# Patient Record
Sex: Male | Born: 1970 | Race: Black or African American | Hispanic: No | Marital: Single | Smoking: Current every day smoker
Health system: Southern US, Community
[De-identification: ages and names within clinical notes are randomized; demographics above are authoritative.]

## PROBLEM LIST (undated history)

## (undated) HISTORY — PX: FACIAL COSMETIC SURGERY: SHX629

---

## 2008-10-17 ENCOUNTER — Emergency Department (HOSPITAL_COMMUNITY): Admission: EM | Admit: 2008-10-17 | Discharge: 2008-10-17 | Payer: Self-pay | Admitting: Emergency Medicine

## 2013-01-09 ENCOUNTER — Emergency Department (HOSPITAL_COMMUNITY)
Admission: EM | Admit: 2013-01-09 | Discharge: 2013-01-09 | Discharge status: Against Medical Advice | Payer: Self-pay | Attending: Emergency Medicine | Admitting: Emergency Medicine

## 2013-01-09 ENCOUNTER — Encounter (HOSPITAL_COMMUNITY): Payer: Self-pay | Admitting: Emergency Medicine

## 2013-01-09 ENCOUNTER — Emergency Department (HOSPITAL_COMMUNITY)
Admission: EM | Admit: 2013-01-09 | Discharge: 2013-01-09 | Disposition: A | Discharge status: Home / Self Care | Payer: Self-pay | Attending: Emergency Medicine | Admitting: Emergency Medicine

## 2013-01-09 DIAGNOSIS — S0180XA Unspecified open wound of other part of head, initial encounter: Secondary | ICD-10-CM | POA: Insufficient documentation

## 2013-01-09 DIAGNOSIS — Y929 Unspecified place or not applicable: Secondary | ICD-10-CM | POA: Insufficient documentation

## 2013-01-09 DIAGNOSIS — Y9389 Activity, other specified: Secondary | ICD-10-CM | POA: Insufficient documentation

## 2013-01-09 DIAGNOSIS — S0181XA Laceration without foreign body of other part of head, initial encounter: Secondary | ICD-10-CM

## 2013-01-09 DIAGNOSIS — W06XXXA Fall from bed, initial encounter: Secondary | ICD-10-CM | POA: Insufficient documentation

## 2013-01-09 DIAGNOSIS — Y92009 Unspecified place in unspecified non-institutional (private) residence as the place of occurrence of the external cause: Secondary | ICD-10-CM | POA: Insufficient documentation

## 2013-01-09 DIAGNOSIS — S01409A Unspecified open wound of unspecified cheek and temporomandibular area, initial encounter: Secondary | ICD-10-CM | POA: Insufficient documentation

## 2013-01-09 DIAGNOSIS — X58XXXA Exposure to other specified factors, initial encounter: Secondary | ICD-10-CM | POA: Insufficient documentation

## 2013-01-09 DIAGNOSIS — W01119A Fall on same level from slipping, tripping and stumbling with subsequent striking against unspecified sharp object, initial encounter: Secondary | ICD-10-CM | POA: Insufficient documentation

## 2013-01-09 NOTE — ED Notes (Signed)
Patient LWBS after triage earlier this morning so he could go home and get his wallet; patient has returned and has been re-registered.  Complaining of laceration to right cheek -- hit dresser while getting up from bed this morning.  Patient denies pain; bleeding controlled.

## 2013-01-09 NOTE — ED Provider Notes (Signed)
History     CSN: [id number]  Arrival date & time 01/09/13  7829   First MD Initiated Contact with Patient 01/09/13 [phone]      Chief Complaint  Patient presents with  . Facial Laceration    (Consider location/radiation/quality/duration/timing/severity/associated sxs/prior treatment) HPI Comments: Patient presents today with a laceration of his right cheek.  He reports that he hit his face on the edge of the dresser while getting out of bed this morning.  No LOC.  He denies pain.  Bleeding controlled at this time.  He reports that his tetanus is UTD.  Patient is a 42 y.o. male presenting with skin laceration. The history is provided by the patient.  Laceration Foreign body present:  No foreign bodies Ineffective treatments:  None tried   History reviewed. No pertinent past medical history.  History reviewed. No pertinent past surgical history.  History reviewed. No pertinent family history.  History  Substance Use Topics  . Smoking status: Never Smoker   . Smokeless tobacco: Not on file  . Alcohol Use: No      Review of Systems  Skin: Positive for wound.  All other systems reviewed and are negative.    Allergies  Review of patient's allergies indicates no known allergies.  Home Medications  No current outpatient prescriptions on file.  BP 120/71  Pulse 80  Temp(Src) 98.1 F (36.7 C) (Oral)  Resp 18  SpO2 97%  Physical Exam  Nursing note and vitals reviewed. Constitutional: He appears well-developed and well-nourished. No distress.  HENT:  Head: Normocephalic and atraumatic.    Eyes: EOM are normal.  Neck: Normal range of motion. Neck supple.  Cardiovascular: Normal rate, regular rhythm and normal heart sounds.   Pulmonary/Chest: Effort normal and breath sounds normal.  Neurological: He is alert. No cranial nerve deficit. Gait normal.  Skin: Skin is warm and dry. Laceration noted. He is not diaphoretic.    ED Course  Procedures (including critical  care time)  Labs Reviewed - No data to display No results found.   No diagnosis found.  LACERATION REPAIR Performed by: Anne Shutter, Carmichael Burdette Authorized by: Anne Shutter, Herbert Seta Consent: Verbal consent obtained. Risks and benefits: risks, benefits and alternatives were discussed Consent given by: patient Patient identity confirmed: provided demographic data Prepped and Draped in normal sterile fashion Wound explored  Laceration Location: right cheek  Laceration Length: 1.5 cm  No Foreign Bodies seen or palpated  Anesthesia: local infiltration  Local anesthetic: lidocaine 2% with epinephrine  Anesthetic total: 3 ml  Irrigation method: syringe Amount of cleaning: standard  Skin closure: 5-0 Nylon  Number of sutures: 3  Technique: Simple interrupted  Patient tolerance: Patient tolerated the procedure well with no immediate complications.  MDM  Patient presenting with facial laceration.  Tetanus UTD.  Laceration repaired without difficulty.          Pascal Lux Stockton, PA-C 01/09/13 1734

## 2013-01-09 NOTE — ED Notes (Signed)
Pt dc'd home w/all belongings, no new rx given, alert and ambulatory upon dc, drove self home, no narcotics given in ed

## 2013-01-09 NOTE — ED Notes (Signed)
Patient walking out to his car to get his Art therapist.

## 2013-01-09 NOTE — ED Notes (Signed)
Pt c/o laceration under (L) eye d/t hitting it on the corner of his dresser upon waking this am. Pt turned his furniture around last night and forgot his dresser was now beside of his bed. Pt denies dizziness or LOC. approx 1 in lac

## 2013-01-09 NOTE — ED Notes (Addendum)
Patient told registration that he was going home to get his wallet; patient advised not to leave by registration.  Patient LWBS after triage without notifying medical staff.

## 2013-01-09 NOTE — ED Provider Notes (Signed)
Medical screening examination/treatment/procedure(s) were performed by non-physician practitioner and as supervising physician I was immediately available for consultation/collaboration.   Gavin Pound. Oletta Lamas, MD 01/09/13 2155

## 2013-01-09 NOTE — ED Notes (Signed)
Patient reports that he hit his right cheek on a dresser this morning when he was getting out of bed; 3/4 inch laceration noted to right cheek; no bleeding at this time.  Will continue to monitor.

## 2013-01-14 ENCOUNTER — Encounter (HOSPITAL_COMMUNITY): Payer: Self-pay | Admitting: Emergency Medicine

## 2013-01-14 ENCOUNTER — Emergency Department (INDEPENDENT_AMBULATORY_CARE_PROVIDER_SITE_OTHER)
Admission: EM | Admit: 2013-01-14 | Discharge: 2013-01-14 | Disposition: A | Discharge status: Home / Self Care | Payer: Self-pay | Source: Home / Self Care | Attending: Emergency Medicine | Admitting: Emergency Medicine

## 2013-01-14 DIAGNOSIS — IMO0002 Reserved for concepts with insufficient information to code with codable children: Secondary | ICD-10-CM

## 2013-01-14 DIAGNOSIS — T148XXA Other injury of unspecified body region, initial encounter: Secondary | ICD-10-CM

## 2013-01-14 NOTE — ED Provider Notes (Signed)
Chief Complaint:  Laceration, suture removal.  History of Present Illness:   Sean Gray is a 42 year old male who sustained a laceration to his right cheek a week ago. This was sutured at the hospital emergency room he returns today for suture removal. The wound is healing well without evidence of infection. He denies any headache, visual, or neurological symptoms.  Review of Systems:  Other than noted above, the patient denies any of the following symptoms: Systemic:  No fevers, chills, sweats, weight loss or gain, fatigue, or tiredness. ENT: No facial pain or swelling. Neurological: No headache, diplopia, blurry vision, paresthesias, difficulty speaking, or difficulty with ambulation.  PMFSH:  Past medical history, family history, social history, meds, and allergies were reviewed.   Physical Exam:   Vital signs:  BP 121/88  Pulse 80  Temp(Src) 98.1 F (36.7 C) (Oral)  Resp 16  SpO2 100% General:  Alert and oriented.  In no distress.  Skin warm and dry. Eyes: PERRLA, lids and conjunctiva is normal. ENT: He has a small laceration on his right cheek which had been closed with 3 sutures. There is no swelling or evidence of infection. Nose, lips, neck, and ears all appear normal. Neurological exam: Alert and oriented x3, speech is clear and appropriate, no extremity weakness, no facial asymmetry.  Course in Urgent Care Center:   The wound was prepped with alcohol, sutures were removed, antibiotic ointment and a Band-Aid dressing were applied. Patient was instructed in wound care.  Assessment:  The encounter diagnosis was Laceration.  Plan:   1.  The following meds were prescribed:   New Prescriptions   No medications on file   2.  The patient was instructed in symptomatic care and handouts were given.he was given instructions in wound care. 3.  The patient was told to return if becoming worse in any way, if no better in 3 or 4 days, and given some red flag symptoms such as evidence  of infection such as swelling, redness, or purulent drainage that would indicate earlier return.     Reuben Likes, MD 01/14/13 [phone]

## 2013-01-14 NOTE — ED Notes (Signed)
Patient presents for suture removal.

## 2013-05-07 ENCOUNTER — Encounter (HOSPITAL_COMMUNITY): Payer: Self-pay | Admitting: Emergency Medicine

## 2013-05-07 ENCOUNTER — Emergency Department (HOSPITAL_COMMUNITY)
Admission: EM | Admit: 2013-05-07 | Discharge: 2013-05-07 | Disposition: A | Discharge status: Home / Self Care | Payer: Self-pay | Attending: Emergency Medicine | Admitting: Emergency Medicine

## 2013-05-07 DIAGNOSIS — F17200 Nicotine dependence, unspecified, uncomplicated: Secondary | ICD-10-CM | POA: Insufficient documentation

## 2013-05-07 DIAGNOSIS — K047 Periapical abscess without sinus: Secondary | ICD-10-CM | POA: Insufficient documentation

## 2013-05-07 MED ORDER — PENICILLIN V POTASSIUM 500 MG PO TABS: 500.0000 mg | ORAL_TABLET | Freq: Four times a day (QID) | ORAL | Status: AC

## 2013-05-07 MED ORDER — HYDROCODONE-ACETAMINOPHEN 5-325 MG PO TABS: 1.0000 | ORAL_TABLET | ORAL | Status: DC | PRN

## 2013-05-07 NOTE — ED Provider Notes (Signed)
Medical screening examination/treatment/procedure(s) were performed by non-physician practitioner and as supervising physician I was immediately available for consultation/collaboration.   Gwyneth Sprout, MD 05/07/13 2156

## 2013-05-07 NOTE — ED Provider Notes (Signed)
  CSN: [id number]     Arrival date & time 05/07/13  4098 History     First MD Initiated Contact with Patient 05/07/13 [phone]     Chief Complaint  Patient presents with  . Dental Pain   (Consider location/radiation/quality/duration/timing/severity/associated sxs/prior Treatment) The history is provided by the patient and medical records.   Patient resents to the ED for right upper dental pain. Patient states the pain is very intense, and he has been unable to sleep all night. Pain described as a throbbing sensation along his upper jaw.  Denies any difficulty swallowing. No trismus. No history of prior dental abscess. No numbness or paresthesias of face.  Patient is not currently established with a dentist. Has not taken any medications for his symptoms.  History reviewed. No pertinent past medical history. History reviewed. No pertinent past surgical history. No family history on file. History  Substance Use Topics  . Smoking status: Current Every Day Smoker  . Smokeless tobacco: Not on file  . Alcohol Use: Yes    Review of Systems  HENT: Positive for dental problem.   All other systems reviewed and are negative.    Allergies  Review of patient's allergies indicates no known allergies.  Home Medications  No current outpatient prescriptions on file. BP 126/88  Pulse 64  Temp(Src) 97.4 F (36.3 C) (Oral)  Resp 22  SpO2 97%  Physical Exam  Nursing note and vitals reviewed. Constitutional: He is oriented to person, place, and time. He appears well-developed and well-nourished.  HENT:  Head: Normocephalic and atraumatic. No trismus in the jaw.  Mouth/Throat: Uvula is midline, oropharynx is clear and moist and mucous membranes are normal. No oral lesions. Abnormal dentition. Dental abscesses and dental caries present. No edematous. No oropharyngeal exudate, posterior oropharyngeal edema, posterior oropharyngeal erythema or tonsillar abscesses.    Teeth largely in poor  dentition,  Right upper molar TTP, surrounding gingiva swollen and erythematous with exudate present; no oropharyngeal edema or PTA, airway patent, handling secretions appropriately, no trismus  Eyes: Conjunctivae and EOM are normal. Pupils are equal, round, and reactive to light.  Neck: Normal range of motion. Neck supple.  Cardiovascular: Normal rate, regular rhythm and normal heart sounds.   Pulmonary/Chest: Effort normal and breath sounds normal.  Musculoskeletal: Normal range of motion.  Neurological: He is alert and oriented to person, place, and time.  Skin: Skin is warm and dry.  Psychiatric: He has a normal mood and affect.    ED Course   Procedures (including critical care time)  Labs Reviewed - No data to display No results found.  1. Dental abscess     MDM   Dental pain and dental abscess.  Pt handling secretions appropriately, airway patent.  Rx pen VK and vicodin.  FU with dentist-- community list given.  Discussed plan with pt, he agreed.  Return precautions advised.  Garlon Hatchet, PA-C 05/07/13 1044

## 2013-05-07 NOTE — ED Notes (Signed)
Started last pm with right upper dental pain. Pt is drooling and spitting into a bag. States pain is severe.

## 2013-09-06 ENCOUNTER — Emergency Department (HOSPITAL_COMMUNITY)
Admission: EM | Admit: 2013-09-06 | Discharge: 2013-09-06 | Disposition: A | Discharge status: Home / Self Care | Payer: Self-pay | Attending: Emergency Medicine | Admitting: Emergency Medicine

## 2013-09-06 ENCOUNTER — Encounter (HOSPITAL_COMMUNITY): Payer: Self-pay | Admitting: Emergency Medicine

## 2013-09-06 DIAGNOSIS — L02818 Cutaneous abscess of other sites: Secondary | ICD-10-CM | POA: Insufficient documentation

## 2013-09-06 DIAGNOSIS — L02811 Cutaneous abscess of head [any part, except face]: Secondary | ICD-10-CM

## 2013-09-06 DIAGNOSIS — F17200 Nicotine dependence, unspecified, uncomplicated: Secondary | ICD-10-CM | POA: Insufficient documentation

## 2013-09-06 MED ORDER — SULFAMETHOXAZOLE-TRIMETHOPRIM 800-160 MG PO TABS: 1.0000 | ORAL_TABLET | Freq: Two times a day (BID) | ORAL | Status: DC

## 2013-09-06 NOTE — ED Notes (Signed)
Pt reports developing an abscess to the posterior of his head about one month ago. Pt attempted to drain this himself with a sewing needle. Pt reports developing another abscess on the posterior of his head, which he wanted to be evaluated due to the size. Pt denies fever and is afebrile during triage. Pt states he has had a tetanus vaccine less than five years ago.

## 2013-09-06 NOTE — ED Provider Notes (Signed)
CSN: [id number]     Arrival date & time 09/06/13  2055 History   First MD Initiated Contact with Patient 09/06/13 2104    This chart was scribed for Ivonne Andrew PA-C, a non-physician practitioner working with Suzi Roots, MD by Lewanda Rife, ED Scribe. This patient was seen in room WTR8/WTR8 and the patient's care was started at 9:53 PM     Chief Complaint  Patient presents with  . Abscess   The history is provided by the patient. No language interpreter was used.   HPI Comments: Sean Gray is a 42 y.o. male who presents to the Emergency Department complaining of worsening abscess to the occipital scalp onset 1 month. Reports pt attempted to drain abscess himself with a sewing needle. Since that time there has been worsening pain, redness and swelling. Reports associated occasional drainage. Describes abscess as severely painful. Reports pain is exacerbated by touch and alleviated by nothing. Denies associated fever. Reports tetanus status is up to date within the last 5 years.    History reviewed. No pertinent past medical history. History reviewed. No pertinent past surgical history. No family history on file. History  Substance Use Topics  . Smoking status: Current Every Day Smoker  . Smokeless tobacco: Not on file  . Alcohol Use: Yes    Review of Systems  Constitutional: Negative for fever and chills.  Skin:       Abscess   All other systems reviewed and are negative.  A complete 10 system review of systems was obtained and all systems are negative except as noted in the HPI and PMHx.     Allergies  Review of patient's allergies indicates no known allergies.  Home Medications   Current Outpatient Rx  Name  Route  Sig  Dispense  Refill  . ibuprofen (ADVIL,MOTRIN) 200 MG tablet   Oral   Take 200 mg by mouth every 6 (six) hours as needed.         . Nutritional Supplements (DETOX ENZYME FORMULA PO)   Oral   Take 1 tablet by mouth daily.          BP  144/88  Pulse 86  Temp(Src) 98.4 F (36.9 C) (Oral)  Resp 20  SpO2 98% Physical Exam  Nursing note and vitals reviewed. Constitutional: He is oriented to person, place, and time. He appears well-developed and well-nourished. No distress.  HENT:  Head: Normocephalic.  Eyes: Conjunctivae are normal.  Cardiovascular: Normal rate and regular rhythm.   Pulmonary/Chest: Effort normal and breath sounds normal.  Neurological: He is alert and oriented to person, place, and time.  Skin: Skin is warm.  2 cm nodular area to the right posterior lower scalp with surrounding erythema and induration. There is a central area of skin damage and scabbing. No bleeding or drainage.  Psychiatric: He has a normal mood and affect. His behavior is normal.    ED Course  Procedures   COORDINATION OF CARE:  Nursing notes reviewed. Vital signs reviewed. Initial pt interview and examination performed.   9:50 PM-patient seen and evaluated. Patient well-appearing no acute distress. Does not appear severely ill or toxic. Discussed treatment plan with pt at bedside, which includes I&D. Pt agrees with plan.  10:06 PM INCISION AND DRAINAGE Performed [id number] Dariusz Brase PA-C Consent: Verbal consent obtained. Risks and benefits: risks, benefits and alternatives were discussed Time out performed prior to procedure Type: abscess Body area: occipital scalp Anesthesia: local infiltration Incision was made with a scalpel. Local anesthetic:  lidocaine 2% without epinephrine Anesthetic total: 2 ml Complexity: complex Blunt dissection to break up loculations Drainage: purulent Drainage amount: Small  Packing material: none Patient tolerance: Patient tolerated the procedure well with no immediate complications.       MDM   1. Abscess of scalp      I personally performed the services described in this documentation, which was scribed in my presence. The recorded information has been reviewed and is  accurate.     Angus Seller, PA-C 09/06/13 2224

## 2013-09-09 NOTE — ED Provider Notes (Signed)
Medical screening examination/treatment/procedure(s) were performed by non-physician practitioner and as supervising physician I was immediately available for consultation/collaboration.  EKG Interpretation   None         Malayzia Laforte E Lorin Hauck, MD 09/09/13 1055 

## 2013-12-11 ENCOUNTER — Emergency Department (HOSPITAL_COMMUNITY)
Admission: EM | Admit: 2013-12-11 | Discharge: 2013-12-12 | Disposition: A | Discharge status: Home / Self Care | Payer: Self-pay | Attending: Emergency Medicine | Admitting: Emergency Medicine

## 2013-12-11 DIAGNOSIS — L0211 Cutaneous abscess of neck: Secondary | ICD-10-CM | POA: Insufficient documentation

## 2013-12-11 DIAGNOSIS — L02818 Cutaneous abscess of other sites: Secondary | ICD-10-CM | POA: Insufficient documentation

## 2013-12-11 DIAGNOSIS — L03221 Cellulitis of neck: Principal | ICD-10-CM

## 2013-12-11 DIAGNOSIS — L03818 Cellulitis of other sites: Secondary | ICD-10-CM

## 2013-12-11 DIAGNOSIS — F17200 Nicotine dependence, unspecified, uncomplicated: Secondary | ICD-10-CM | POA: Insufficient documentation

## 2013-12-12 ENCOUNTER — Encounter (HOSPITAL_COMMUNITY): Payer: Self-pay | Admitting: Emergency Medicine

## 2013-12-12 MED ORDER — CEPHALEXIN 500 MG PO CAPS: 1000.0000 mg | ORAL_CAPSULE | Freq: Two times a day (BID) | ORAL | Status: AC

## 2013-12-12 MED ORDER — IBUPROFEN 800 MG PO TABS
800.0000 mg | ORAL_TABLET | Freq: Once | ORAL | Status: AC
  Administered 2013-12-12: 800 mg via ORAL
  Filled 2013-12-12: qty 1

## 2013-12-12 MED ORDER — HYDROCODONE-ACETAMINOPHEN 5-325 MG PO TABS: 1.0000 | ORAL_TABLET | ORAL | Status: AC | PRN

## 2013-12-12 MED ORDER — SULFAMETHOXAZOLE-TRIMETHOPRIM 800-160 MG PO TABS: 1.0000 | ORAL_TABLET | Freq: Two times a day (BID) | ORAL | Status: AC

## 2013-12-12 NOTE — ED Notes (Signed)
Pt reports an abscess to the back of his head at his hair line for the past week, yellow exudate noted, pt denies scratching this area. Pt a&o x4, NAD noted at this time

## 2013-12-12 NOTE — ED Provider Notes (Signed)
Medical screening examination/treatment/procedure(s) were performed by non-physician practitioner and as supervising physician I was immediately available for consultation/collaboration.  Shanna CiscoMegan E Docherty, MD 12/12/13 2003

## 2013-12-12 NOTE — Discharge Instructions (Signed)
Take vicodin as prescribed for severe pain.   Do not drive within four hours of taking this medication (may cause drowsiness or confusion).  Follow up with your primary care doctor or [facility] [phone]; [address]) or return to the ER in 2 days for wound recheck and packing removal.  You should be seen sooner if you develop fever or spreading of redness around wound.

## 2013-12-12 NOTE — ED Provider Notes (Signed)
CSN: [id number]     Arrival date & time 12/11/13  2353 History   First MD Initiated Contact with Patient 12/12/13 0422     Chief Complaint  Patient presents with  . Abscess     (Consider location/radiation/quality/duration/timing/severity/associated sxs/prior Treatment) HPI History provided by pt.   Pt presents w/ abscess of L occipital scalp x 1 week.  Pain has gradually worsened.  No relief w/ warm compresses.  No associated fever or other skin changes.  Denies trauma. No pertinent PMH.  History reviewed. No pertinent past medical history. History reviewed. No pertinent past surgical history. History reviewed. No pertinent family history. History  Substance Use Topics  . Smoking status: Current Every Day Smoker  . Smokeless tobacco: Not on file  . Alcohol Use: Yes    Review of Systems  All other systems reviewed and are negative.      Allergies  Review of patient's allergies indicates no known allergies.  Home Medications   Current Outpatient Rx  Name  Route  Sig  Dispense  Refill  . cephALEXin (KEFLEX) 500 MG capsule   Oral   Take 2 capsules (1,000 mg total) by mouth 2 (two) times daily.   28 capsule   0   . HYDROcodone-acetaminophen (NORCO/VICODIN) 5-325 MG per tablet   Oral   Take 1 tablet by mouth every 4 (four) hours as needed for moderate pain.   12 tablet   0   . sulfamethoxazole-trimethoprim (BACTRIM DS,SEPTRA DS) 800-160 MG per tablet   Oral   Take 1 tablet by mouth 2 (two) times daily.   14 tablet   0    BP 154/108  Pulse 67  Temp(Src) 98.5 F (36.9 C) (Oral)  Resp 15  Ht 5\' 8"  (1.727 m)  Wt 145 lb (65.772 kg)  BMI 22.05 kg/m2  SpO2 98% Physical Exam  Nursing note and vitals reviewed. Constitutional: He is oriented to person, place, and time. He appears well-developed and well-nourished. No distress.  HENT:  Head: Normocephalic and atraumatic.  1.5cm fluctuant abscess L occipital scalp w/ 3cm surrounding erythema and induration.  Ttp.   Eyes:  Normal appearance  Neck: Normal range of motion.  Pulmonary/Chest: Effort normal.  Musculoskeletal: Normal range of motion.  Neurological: He is alert and oriented to person, place, and time.  Psychiatric: He has a normal mood and affect. His behavior is normal.    ED Course  Procedures (including critical care time) INCISION AND DRAINAGE Performed by: Ruby ColaSCHINLEVER, Yonael Tulloch E Consent: Verbal consent obtained. Risks and benefits: risks, benefits and alternatives were discussed Type: abscess  Body area: L occipital scalp  Anesthesia: local infiltration  Incision was made with a scalpel.  Local anesthetic: lidocaine 1 w/out epinephrine  Anesthetic total: 3 ml  Complexity: complex Blunt dissection to break up loculations  Drainage: purulent  Drainage amount: moderate  Packing material: none  Patient tolerance: Patient tolerated the procedure well with no immediate complications.    Labs Review Labs Reviewed - No data to display Imaging Review No results found.   EKG Interpretation None      MDM   Final diagnoses:  Cellulitis and abscess of neck    43yo immunocompetent M presents w/ abscess and cellulitis L occipital scalp.  I&D'd.  Pt d/c'd home w/ bactrim, keflex and vicodin.  Return precautions discussed.     Sean Miuatherine E Doyce Stonehouse, PA-C 12/12/13 [phone]

## 2013-12-12 NOTE — ED Notes (Signed)
Patient is alert and oriented x3.  He was given DC instructions and follow up visit instructions.  Patient gave verbal understanding.  He was DC ambulatory under his own power to home.  V/S stable.  He was not showing any signs of distress on DC 

## 2013-12-12 NOTE — ED Notes (Signed)
Patient is alert and oriented x3.  He is complaining of a boil like area on the back of his neck in the hair line.  He does not having any drainage coming from the area but states the area is very tender.  Currently he rates his pain 7 of 10

## 2014-03-06 ENCOUNTER — Emergency Department (HOSPITAL_COMMUNITY)
Admission: EM | Admit: 2014-03-06 | Discharge: 2014-03-06 | Disposition: A | Discharge status: Home / Self Care | Payer: Self-pay | Attending: Emergency Medicine | Admitting: Emergency Medicine

## 2014-03-06 ENCOUNTER — Encounter (HOSPITAL_COMMUNITY): Payer: Self-pay | Admitting: Emergency Medicine

## 2014-03-06 DIAGNOSIS — J020 Streptococcal pharyngitis: Secondary | ICD-10-CM | POA: Insufficient documentation

## 2014-03-06 DIAGNOSIS — F17200 Nicotine dependence, unspecified, uncomplicated: Secondary | ICD-10-CM | POA: Insufficient documentation

## 2014-03-06 DIAGNOSIS — Z792 Long term (current) use of antibiotics: Secondary | ICD-10-CM | POA: Insufficient documentation

## 2014-03-06 MED ORDER — AMOXICILLIN 500 MG PO CAPS: 500.0000 mg | ORAL_CAPSULE | Freq: Three times a day (TID) | ORAL | Status: AC

## 2014-03-06 NOTE — ED Notes (Signed)
Pt c/o sore throat x 2 days, sts he went swimming in a pool and then his throat started hurting and his "glands are swollen". Nad, skin warm and dry, resp e/u. Denies sob.

## 2014-03-06 NOTE — Discharge Instructions (Signed)
Take amoxicillin as directed until gone. Refer to attached documents for more information. Return to the ED with worsening or concerning symptoms.  °

## 2014-03-06 NOTE — ED Provider Notes (Signed)
CSN: [id number]     Arrival date & time 03/06/14  1532 History  This chart was scribed for non-physician practitioner Emilia Beck, PA-C working with Layla Maw Ward, DO by Danella Maiers, ED Scribe. This patient was seen in room TR08C/TR08C and the patient's care was started at 4:13 PM.    Chief Complaint  Patient presents with  . Sore Throat   The history is provided by the patient. No language interpreter was used.   HPI Comments: Sean Gray is a 43 y.o. male who presents to the Emergency Department complaining of a constant gradually-worsening sore throat onset 2 days ago since swimming in a pool. He reports associated pain with swallowing. He reports swollen lymph nodes in the neck, worse on the left. He denies cough, nasal congestion, fevers, or voice change. He denies seasonal allergies.    History reviewed. No pertinent past medical history. Past Surgical History  Procedure Laterality Date  . Facial cosmetic surgery     No family history on file. History  Substance Use Topics  . Smoking status: Current Every Day Smoker  . Smokeless tobacco: Not on file  . Alcohol Use: Yes    Review of Systems  Constitutional: Negative for fever.  HENT: Positive for sore throat. Negative for congestion and voice change.   Respiratory: Negative for cough.   All other systems reviewed and are negative.     Allergies  Review of patient's allergies indicates no known allergies.  Home Medications   Prior to Admission medications   Medication Sig Start Date End Date Taking? Authorizing Provider  cephALEXin (KEFLEX) 500 MG capsule Take 2 capsules (1,000 mg total) by mouth 2 (two) times daily. 12/12/13   Arie Sabina Schinlever, PA-C  HYDROcodone-acetaminophen (NORCO/VICODIN) 5-325 MG per tablet Take 1 tablet by mouth every 4 (four) hours as needed for moderate pain. 12/12/13   Arie Sabina Schinlever, PA-C   BP 133/89  Pulse 85  Temp(Src) 98.3 F (36.8 C)  Resp 16  Ht 5\' 8"  (1.727  m)  Wt 173 lb 5 oz (78.614 kg)  BMI 26.36 kg/m2  SpO2 99% Physical Exam  Nursing note and vitals reviewed. Constitutional: He is oriented to person, place, and time. He appears well-developed and well-nourished. No distress.  HENT:  Head: Normocephalic and atraumatic.  Unable to visualize the back of the throat because pt non-cooperative  Eyes: EOM are normal.  Neck: Neck supple. No tracheal deviation present.  Cardiovascular: Normal rate.   Pulmonary/Chest: Effort normal. No respiratory distress.  Musculoskeletal: Normal range of motion.  Neurological: He is alert and oriented to person, place, and time.  Skin: Skin is warm and dry.  Psychiatric: He has a normal mood and affect. His behavior is normal.    ED Course  Procedures (including critical care time) Medications - No data to display  DIAGNOSTIC STUDIES: Oxygen Saturation is 99% on RA, normal by my interpretation.    COORDINATION OF CARE: 4:40 PM- Discussed treatment plan with pt. Pt agrees to plan.    Labs Review Labs Reviewed - No data to display  Imaging Review No results found.   EKG Interpretation None      MDM   Final diagnoses:  Strep throat    4:47 PM Patient not cooperative on exam. Patient will have amoxicillin for possible infection. Vitals stable and patient afebrile.   I personally performed the services described in this documentation, which was scribed in my presence. The recorded information has been reviewed and is accurate.  Emilia BeckKaitlyn Hayato Guaman, [place] JerseyPA-C 03/06/14 [phone]

## 2014-03-06 NOTE — ED Provider Notes (Signed)
Medical screening examination/treatment/procedure(s) were performed by non-physician practitioner and as supervising physician I was immediately available for consultation/collaboration.   EKG Interpretation None        Layla Maw Blanca Thornton, DO 03/06/14 1650

## 2014-09-16 ENCOUNTER — Emergency Department (INDEPENDENT_AMBULATORY_CARE_PROVIDER_SITE_OTHER)
Admission: EM | Admit: 2014-09-16 | Discharge: 2014-09-16 | Disposition: A | Discharge status: Home / Self Care | Payer: Self-pay | Source: Home / Self Care | Attending: Emergency Medicine | Admitting: Emergency Medicine

## 2014-09-16 ENCOUNTER — Encounter (HOSPITAL_COMMUNITY): Payer: Self-pay | Admitting: Emergency Medicine

## 2014-09-16 DIAGNOSIS — N529 Male erectile dysfunction, unspecified: Secondary | ICD-10-CM

## 2014-09-16 LAB — POCT I-STAT, CHEM 8
BUN: 19 mg/dL (ref 6–23)
CALCIUM ION: 1.18 mmol/L (ref 1.12–1.23)
CHLORIDE: 104 meq/L (ref 96–112)
Creatinine, Ser: 1.5 mg/dL — ABNORMAL HIGH (ref 0.50–1.35)
GLUCOSE: 111 mg/dL — AB (ref 70–99)
HEMATOCRIT: 50 % (ref 39.0–52.0)
Hemoglobin: 17 g/dL (ref 13.0–17.0)
Potassium: 4.2 mEq/L (ref 3.7–5.3)
Sodium: 140 mEq/L (ref 137–147)
TCO2: 29 mmol/L (ref 0–100)

## 2014-09-16 MED ORDER — SILDENAFIL CITRATE 100 MG PO TABS: 100.0000 mg | ORAL_TABLET | Freq: Every day | ORAL | Status: AC | PRN

## 2014-09-16 MED ORDER — SILDENAFIL CITRATE 100 MG PO TABS: 100.0000 mg | ORAL_TABLET | Freq: Every day | ORAL | Status: DC | PRN

## 2014-09-16 NOTE — ED Provider Notes (Signed)
Chief Complaint   Erectile Dysfunction   History of Present Illness   Sean Gray is a 43 year old male who has had a six-month history of erectile dysfunction. The patient states before than his sexual function was normal. Since then he's had no spontaneous erections. Sometimes he can get a partial erection, but not sufficient for penetration. Occasionally he will lose an erection during intercourse. He denies any difficulty urinating, bladder or bowel incontinence, or saddle anesthesia. He's had no leg pain or claudication. No numbness or tingling of the lower extremities and no weakness of legs. He denies any lower back pain. No history of diabetes or thyroid disease. He's not seen a physician for evaluation of this previously. He has felt somewhat stressed out but denies any significant depression. He states he and his wife's relationship is good, however it is being strained by this problem.  Review of Systems   Other than as noted above, the patient denies any of the following symptoms: General:  No fever or chills. GI:  No abdominal pain, back pain, nausea, or vomiting. GU: Hematuria, urethral discharge, penile lesions, penile pain, testicular pain, swelling, or mass, or inguinal lymphadenopathy.  PMFSH   Past medical history, family history, social history, meds, and allergies were reviewed.    Physical Exam    Vital signs:  BP 131/88 mmHg  Pulse 74  Temp(Src) 97.8 F (36.6 C) (Oral)  Resp 14  SpO2 98% Gen:  Alert, oriented, in no distress. Lungs:  Clear to auscultation, no wheezes, rales or rhonchi. Heart:  Regular rhythm, no gallop or murmer. Abdomen:  Flat and soft.  No tenderness to palpation, guarding, or rebound.  No hepato-splenomegaly or mass.  Bowel sounds were normally active.  No hernia. Genital exam:  Normal exam, no urethral discharge, no penile lesions, testes were nontender and without masses. Back:  No CVA tenderness.  Extremities: No edema, pulses full,  normal strength and sensation, normal DTRs. Skin:  Clear, warm and dry.  Labs   Results for orders placed or performed during the hospital encounter of 09/16/14  I-STAT, chem 8  Result Value Ref Range   Sodium 140 137 - 147 mEq/L   Potassium 4.2 3.7 - 5.3 mEq/L   Chloride 104 96 - 112 mEq/L   BUN 19 6 - 23 mg/dL   Creatinine, Ser 9.601.50 (H) 0.50 - 1.35 mg/dL   Glucose, Bld 454111 (H) 70 - 99 mg/dL   Calcium, Ion 0.981.18 1.191.12 - 1.23 mmol/L   TCO2 29 0 - 100 mmol/L   Hemoglobin 17.0 13.0 - 17.0 g/dL   HCT 14.750.0 82.939.0 - 56.252.0 %    Assessment   The encounter diagnosis was Erectile dysfunction, unspecified erectile dysfunction type.   Plan   1.  Meds:  The following meds were prescribed:   Discharge Medication List as of 09/16/2014  2:11 PM    START taking these medications   Details  sildenafil (VIAGRA) 100 MG tablet Take 1 tablet (100 mg total) by mouth daily as needed for erectile dysfunction., Starting 09/16/2014, Until Discontinued, Normal        2.  Patient Education/Counseling:  The patient was given appropriate handouts, self care instructions, and instructed in symptomatic relief.    3.  Follow up:  The patient was told to follow up here if no better in 3 to 4 days, or sooner if becoming worse in any way, and given some red flag symptoms such as fever, persistent vomiting, pain, or difficulty urinating which would  prompt immediate return.  Follow-up with urology within the next week.      Reuben Likesavid C Joelyn Lover, MD 09/16/14 [phone]

## 2014-09-16 NOTE — ED Notes (Signed)
Pt states that he has had erectile problems for 7 months.

## 2014-09-16 NOTE — Discharge Instructions (Signed)
Erectile Dysfunction Erectile dysfunction is the inability to get or sustain a good enough erection to have sexual intercourse. Erectile dysfunction may involve:  Inability to get an erection.  Lack of enough hardness to allow penetration.  Loss of the erection before sex is finished.  Premature ejaculation. CAUSES  Certain drugs, such as:  Pain relievers.  Antihistamines.  Antidepressants.  Blood pressure medicines.  Water pills (diuretics).  Ulcer medicines.  Muscle relaxants.  Illegal drugs.  Excessive drinking.  Psychological causes, such as:  Anxiety.  Depression.  Sadness.  Exhaustion.  Performance fear.  Stress.  Physical causes, such as:  Artery problems. This may include diabetes, smoking, liver disease, or atherosclerosis.  High blood pressure.  Hormonal problems, such as low testosterone.  Obesity.  Nerve problems. This may include back or pelvic injuries, diabetes mellitus, multiple sclerosis, or Parkinson disease. SYMPTOMS  Inability to get an erection.  Lack of enough hardness to allow penetration.  Loss of the erection before sex is finished.  Premature ejaculation.  Normal erections at some times, but with frequent unsatisfactory episodes.  Orgasms that are not satisfactory in sensation or frequency.  Low sexual satisfaction in either partner because of erection problems.  A curved penis occurring with erection. The curve may cause pain or may be too curved to allow for intercourse.  Never having nighttime erections. DIAGNOSIS Your caregiver can often diagnose this condition by:  Performing a physical exam to find other diseases or specific problems with the penis.  Asking you detailed questions about the problem.  Performing blood tests to check for diabetes mellitus or to measure hormone levels.  Performing urine tests to find other underlying health conditions.  Performing an ultrasound exam to check for  scarring.  Performing a test to check blood flow to the penis.  Doing a sleep study at home to measure nighttime erections. TREATMENT   You may be prescribed medicines by mouth.  You may be given medicine injections into the penis.  You may be prescribed a vacuum pump with a ring.  Penile implant surgery may be performed. You may receive:  An inflatable implant.  A semirigid implant.  Blood vessel surgery may be performed. HOME CARE INSTRUCTIONS  If you are prescribed oral medicine, you should take the medicine as prescribed. Do not increase the dosage without first discussing it with your physician.  If you are using self-injections, be careful to avoid any veins that are on the surface of the penis. Apply pressure to the injection site for 5 minutes.  If you are using a vacuum pump, make sure you have read the instructions before using it. Discuss any questions with your physician before taking the pump home. SEEK MEDICAL CARE IF:  You experience pain that is not responsive to the pain medicine you have been prescribed.  You experience nausea or vomiting. SEEK IMMEDIATE MEDICAL CARE IF:   When taking oral or injectable medications, you experience an erection that lasts longer than 4 hours. If your physician is unavailable, go to the nearest emergency room for evaluation. An erection that lasts much longer than 4 hours can result in permanent damage to your penis.  You have pain that is severe.  You develop redness, severe pain, or severe swelling of your penis.  You have redness spreading up into your groin or lower abdomen.  You are unable to pass your urine. Document Released: 09/24/2000 Document Revised: 05/30/2013 Document Reviewed: 03/01/2013 Exit[employer] Patient Information 2015 [employer]. This information is not   intended to replace advice given to you by your health care provider. Make sure you discuss any questions you have with your health care provider.  

## 2017-03-27 ENCOUNTER — Emergency Department (HOSPITAL_COMMUNITY)
Admission: EM | Admit: 2017-03-27 | Discharge: 2017-03-27 | Disposition: A | Discharge status: Home / Self Care | Payer: Medicaid Other | Attending: Emergency Medicine | Admitting: Emergency Medicine

## 2017-03-27 DIAGNOSIS — Z79899 Other long term (current) drug therapy: Secondary | ICD-10-CM | POA: Diagnosis not present

## 2017-03-27 DIAGNOSIS — F17200 Nicotine dependence, unspecified, uncomplicated: Secondary | ICD-10-CM | POA: Diagnosis not present

## 2017-03-27 DIAGNOSIS — R109 Unspecified abdominal pain: Secondary | ICD-10-CM | POA: Insufficient documentation

## 2017-03-27 LAB — URINALYSIS, ROUTINE W REFLEX MICROSCOPIC
BILIRUBIN URINE: NEGATIVE
Glucose, UA: NEGATIVE mg/dL
Hgb urine dipstick: NEGATIVE
KETONES UR: NEGATIVE mg/dL
LEUKOCYTES UA: NEGATIVE
NITRITE: NEGATIVE
PH: 5.5 (ref 5.0–8.0)
Specific Gravity, Urine: >1.03 — ABNORMAL HIGH (ref 1.005–1.030)

## 2017-03-27 LAB — COMPREHENSIVE METABOLIC PANEL
ALBUMIN: 4.6 g/dL (ref 3.5–5.0)
ALK PHOS: 82 U/L (ref 38–126)
ALT: 18 U/L (ref 17–63)
ANION GAP: 9 (ref 5–15)
AST: 17 U/L (ref 15–41)
BILIRUBIN TOTAL: 1.1 mg/dL (ref 0.3–1.2)
BUN: 12 mg/dL (ref 6–20)
CALCIUM: 8.9 mg/dL (ref 8.9–10.3)
CO2: 24 mmol/L (ref 22–32)
Chloride: 104 mmol/L (ref 101–111)
Creatinine, Ser: 1.3 mg/dL — ABNORMAL HIGH (ref 0.61–1.24)
GFR calc Af Amer: >60 mL/min (ref >60)
GLUCOSE: 107 mg/dL — AB (ref 65–99)
POTASSIUM: 3.7 mmol/L (ref 3.5–5.1)
Sodium: 137 mmol/L (ref 135–145)
TOTAL PROTEIN: 7.9 g/dL (ref 6.5–8.1)

## 2017-03-27 LAB — CBC
HEMATOCRIT: 47.9 % (ref 39.0–52.0)
HEMOGLOBIN: 16.5 g/dL (ref 13.0–17.0)
MCH: 31.9 pg (ref 26.0–34.0)
MCHC: 34.4 g/dL (ref 30.0–36.0)
MCV: 92.5 fL (ref 78.0–100.0)
Platelets: 298 10*3/uL (ref 150–400)
RBC: 5.18 MIL/uL (ref 4.22–5.81)
RDW: 12 % (ref 11.5–15.5)
WBC: 7.6 10*3/uL (ref 4.0–10.5)

## 2017-03-27 LAB — LIPASE, BLOOD: Lipase: 23 U/L (ref 11–51)

## 2017-03-27 LAB — URINALYSIS, MICROSCOPIC (REFLEX): BACTERIA UA: NONE SEEN

## 2017-03-27 MED ORDER — CEFTRIAXONE SODIUM 250 MG IJ SOLR
250.0000 mg | Freq: Once | INTRAMUSCULAR | Status: AC
  Administered 2017-03-27: 250 mg via INTRAMUSCULAR
  Filled 2017-03-27: qty 250

## 2017-03-27 MED ORDER — LIDOCAINE HCL 1 % IJ SOLN
INTRAMUSCULAR | Status: AC
  Administered 2017-03-27: 20 mL
  Filled 2017-03-27: qty 20

## 2017-03-27 MED ORDER — AZITHROMYCIN 250 MG PO TABS
1000.0000 mg | ORAL_TABLET | Freq: Once | ORAL | Status: AC
  Administered 2017-03-27: 1000 mg via ORAL
  Filled 2017-03-27: qty 4

## 2017-03-27 NOTE — ED Notes (Signed)
Family at bedside. 

## 2017-03-27 NOTE — ED Triage Notes (Signed)
Per EMS- pt has neighbor call EMS due to c/o general abdominal pain. Pt reported NVD, no episodes witnessed. Pt is concerned about a hernia. Pain 5/10. Pt is alert, oriented and cooperative.
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
## 2017-03-27 NOTE — Discharge Instructions (Signed)
Please read attached information. If you experience any new or worsening signs or symptoms please return to the emergency room for evaluation. Please follow-up with your primary care provider or specialist as discussed.  °

## 2017-03-27 NOTE — ED Notes (Signed)
Patient walking around being very rude

## 2017-03-27 NOTE — ED Notes (Signed)
Unable to collect labs patient will not come when we called his name

## 2017-03-27 NOTE — ED Triage Notes (Signed)
Pt c/o low medial abdominal pain, nausea, emesis, diarrhea onset today at gym while lifting weights. Pt concerned about a hernia. RN unable to palpate hernia at location of pain.

## 2017-03-28 LAB — GC/CHLAMYDIA PROBE AMP (~~LOC~~) NOT AT ARMC
Chlamydia: NEGATIVE
Neisseria Gonorrhea: NEGATIVE

## 2017-06-27 ENCOUNTER — Emergency Department (HOSPITAL_COMMUNITY)
Admission: EM | Admit: 2017-06-27 | Discharge: 2017-06-27 | Disposition: A | Discharge status: Home / Self Care | Payer: Medicaid Other | Attending: Emergency Medicine | Admitting: Emergency Medicine

## 2017-06-27 ENCOUNTER — Encounter (HOSPITAL_COMMUNITY): Payer: Self-pay | Admitting: *Deleted

## 2017-06-27 ENCOUNTER — Emergency Department (HOSPITAL_COMMUNITY): Payer: Medicaid Other

## 2017-06-27 DIAGNOSIS — F17200 Nicotine dependence, unspecified, uncomplicated: Secondary | ICD-10-CM | POA: Insufficient documentation

## 2017-06-27 DIAGNOSIS — M79662 Pain in left lower leg: Secondary | ICD-10-CM | POA: Insufficient documentation

## 2017-06-27 MED ORDER — ACETAMINOPHEN 500 MG PO TABS
1000.0000 mg | ORAL_TABLET | Freq: Once | ORAL | Status: AC
  Administered 2017-06-27: 1000 mg via ORAL
  Filled 2017-06-27: qty 2

## 2017-06-27 NOTE — ED Notes (Signed)
Pt refused wheelchair for discharge and refused discharge vitals

## 2017-06-27 NOTE — Discharge Instructions (Signed)

## 2017-06-27 NOTE — ED Provider Notes (Signed)
[facility]-EMERGENCY DEPT Provider Note   CSN: [id number] Arrival date & time: 06/27/17  1857     History   Chief Complaint Chief Complaint  Patient presents with  . Leg Injury    HPI Sean Gray is a 46 y.o. male who presents for evaluation of left leg pain. He reports that he was pushing a car this afternoon when he felt and heard a loud pop followed by immediate pain and swelling in his left calf.  He has not attempted any interventions prior to arrival. He denies any recent antibiotic use.    HPI  History reviewed. No pertinent past medical history.  There are no active problems to display for this patient.   Past Surgical History:  Procedure Laterality Date  . FACIAL COSMETIC SURGERY         Home Medications    Prior to Admission medications   Medication Sig Start Date End Date Taking? Authorizing Provider  amoxicillin (AMOXIL) 500 MG capsule Take 1 capsule (500 mg total) by mouth 3 (three) times daily. 03/06/14   Szekalski, Kaitlyn, PA-C  cephALEXin (KEFLEX) 500 MG capsule Take 2 capsules (1,000 mg total) by mouth 2 (two) times daily. 12/12/13   Schinlever, Santina Evans, PA-C  HYDROcodone-acetaminophen (NORCO/VICODIN) 5-325 MG per tablet Take 1 tablet by mouth every 4 (four) hours as needed for moderate pain. 12/12/13   Schinlever, Santina Evans, PA-C  sildenafil (VIAGRA) 100 MG tablet Take 1 tablet (100 mg total) by mouth daily as needed for erectile dysfunction. 09/16/14   Reuben Likes, MD    Family History No family history on file.  Social History Social History  Substance Use Topics  . Smoking status: Current Every Day Smoker  . Smokeless tobacco: Not on file  . Alcohol use Yes     Allergies   Patient has no known allergies.   Review of Systems Review of Systems  Constitutional: Negative for fever.  Musculoskeletal: Positive for arthralgias, gait problem and myalgias.  Skin: Negative for color change, rash and wound.  All other systems reviewed and are  negative.    Physical Exam Updated Vital Signs BP 136/82 (BP Location: Left Arm)   Pulse 95   Temp 99 F (37.2 C) (Oral)   Resp 16   Wt 79.4 kg (175 lb)   SpO2 97%   BMI 24.41 kg/m   Physical Exam  Constitutional: He appears well-developed and well-nourished.  HENT:  Head: Normocephalic and atraumatic.  Cardiovascular: Intact distal pulses.   Left lower extremity is warm and well perfused.  2+ DP/PT pulses.  Musculoskeletal:  There is tenderness to palpation over the medial aspect of left calf.  Tenderness over Limits ability to perform Homans sign, however pressure over lower posterior left leg results in movement of left foot. Left Achilles tendon was palpated, is nonpainful, I suspect it is intact.    Neurological: He is alert.  Sensation intact to left lower extremity  Skin: Skin is warm and dry. He is not diaphoretic.  Nursing note and vitals reviewed.    ED Treatments / Results  Labs (all labs ordered are listed, but only abnormal results are displayed) Labs Reviewed - No data to display  EKG  EKG Interpretation None       Radiology Dg Tibia/fibula Left  Result Date: 06/27/2017 CLINICAL DATA:  Generalized calf pain after pushing a car today. EXAM: LEFT TIBIA AND FIBULA - 2 VIEW COMPARISON:  None. FINDINGS: There is no evidence of fracture or dislocation. Soft tissues are unremarkable.  IMPRESSION: No acute abnormality. Electronically Signed   By: Sherian Rein M.D.   On: 06/27/2017 19:57    Procedures Procedures (including critical care time)  Medications Ordered in ED Medications  acetaminophen (TYLENOL) tablet 1,000 mg (not administered)     Initial Impression / Assessment and Plan / ED Course  I have reviewed the triage vital signs and the nursing notes.  Pertinent labs & imaging results that were available during my care of the patient were reviewed by me and considered in my medical decision making (see chart for details).    Sean Gray  presents for evaluation of acute onset of left calf pain after hearing a pop.  His Achilles tendon appears intact.  I suspect he may have ruptured a muscle in his calf. Due to unilateral leg swelling, DVT was considered however I have a very low suspicion based on the fact that his pain occurred after injury. X-rays were obtained without acute osseous abnormality. Patient's pain was treated in the emergency room with Tylenol, as he drove himself here.  Patient was given follow-up with orthopedic surgery, crutches, an Ace wrap.  He was given instructions on over-the-counter pain management.   At this time there does not appear to be any evidence of an acute emergency medical condition and the patient appears stable for discharge with appropriate outpatient follow up.Diagnosis was discussed with patient who verbalizes understanding and is agreeable to discharge.     Final Clinical Impressions(s) / ED Diagnoses   Final diagnoses:  Pain of left calf    New Prescriptions New Prescriptions   No medications on file     Norman Clay 06/27/17 2116    Shaune Pollack, MD 06/29/17 [phone]

## 2017-06-27 NOTE — ED Triage Notes (Signed)
Pt was pushing a car when he felt a sudden pop, followed by pain in left calf.  Pt has some swelling in this calf.  Pt came in to ED ambulatory with a limp.

## 2017-06-29 ENCOUNTER — Inpatient Hospital Stay (INDEPENDENT_AMBULATORY_CARE_PROVIDER_SITE_OTHER): Payer: Self-pay | Admitting: Orthopedic Surgery

## 2018-02-17 ENCOUNTER — Encounter (HOSPITAL_COMMUNITY): Payer: Self-pay | Admitting: *Deleted

## 2018-02-17 ENCOUNTER — Emergency Department (HOSPITAL_COMMUNITY): Payer: Medicaid Other

## 2018-02-17 ENCOUNTER — Emergency Department (HOSPITAL_BASED_OUTPATIENT_CLINIC_OR_DEPARTMENT_OTHER)
Admit: 2018-02-17 | Discharge: 2018-02-17 | Disposition: A | Discharge status: Home / Self Care | Payer: Medicaid Other | Attending: Emergency Medicine | Admitting: Emergency Medicine

## 2018-02-17 ENCOUNTER — Emergency Department (HOSPITAL_COMMUNITY)
Admission: EM | Admit: 2018-02-17 | Discharge: 2018-02-17 | Discharge status: Against Medical Advice | Payer: Medicaid Other | Attending: Emergency Medicine | Admitting: Emergency Medicine

## 2018-02-17 DIAGNOSIS — R51 Headache: Secondary | ICD-10-CM | POA: Insufficient documentation

## 2018-02-17 DIAGNOSIS — R55 Syncope and collapse: Secondary | ICD-10-CM | POA: Diagnosis not present

## 2018-02-17 DIAGNOSIS — M7989 Other specified soft tissue disorders: Secondary | ICD-10-CM | POA: Insufficient documentation

## 2018-02-17 DIAGNOSIS — Z79899 Other long term (current) drug therapy: Secondary | ICD-10-CM | POA: Insufficient documentation

## 2018-02-17 DIAGNOSIS — M79609 Pain in unspecified limb: Secondary | ICD-10-CM

## 2018-02-17 DIAGNOSIS — F17210 Nicotine dependence, cigarettes, uncomplicated: Secondary | ICD-10-CM | POA: Diagnosis not present

## 2018-02-17 DIAGNOSIS — M79604 Pain in right leg: Secondary | ICD-10-CM | POA: Diagnosis not present

## 2018-02-17 LAB — I-STAT CHEM 8, ED
BUN: 15 mg/dL (ref 6–20)
Calcium, Ion: 1.1 mmol/L — ABNORMAL LOW (ref 1.15–1.40)
Chloride: 103 mmol/L (ref 101–111)
Creatinine, Ser: 1.2 mg/dL (ref 0.61–1.24)
GLUCOSE: 112 mg/dL — AB (ref 65–99)
HEMATOCRIT: 44 % (ref 39.0–52.0)
HEMOGLOBIN: 15 g/dL (ref 13.0–17.0)
POTASSIUM: 3.9 mmol/L (ref 3.5–5.1)
SODIUM: 141 mmol/L (ref 135–145)
TCO2: 25 mmol/L (ref 22–32)

## 2018-02-17 LAB — I-STAT TROPONIN, ED: TROPONIN I, POC: 0 ng/mL (ref 0.00–0.08)

## 2018-02-17 NOTE — ED Notes (Signed)
Pt outside in [place] requesting to know why he passed out. RN gave discharge paperwork and told pt to follow up with orthopedic surgeon.

## 2018-02-17 NOTE — ED Notes (Signed)
While RN and EMT attempted to perform orthostatics, pt reports it hurt too much to stand for the 3 minute mark for the orthostatics.

## 2018-02-17 NOTE — Discharge Instructions (Signed)
Apply moist heat or ice to the area(s) of discomfort, for 15 minutes at a time, several times per day for the next few days.  Do not fall asleep on a heating or ice pack. Use the crutches to walk until you are seen in follow up. You will need further imaging studies of your right calf to complete your diagnosis.  Call the Orthopedic doctor and your regular medical doctor today to schedule a follow up appointment next week.  Return to the Emergency Department immediately if worsening.

## 2018-02-17 NOTE — ED Notes (Signed)
ED Provider at bedside. 

## 2018-02-17 NOTE — Progress Notes (Addendum)
  02/17/18 12:43 PM

## 2018-02-17 NOTE — ED Notes (Signed)
Vascular tech at bedside. °

## 2018-02-17 NOTE — ED Notes (Signed)
FAMILY CONTACT-  Melvyn Neth [phone]

## 2018-02-17 NOTE — ED Notes (Signed)
Pt stated he could not walk to room, so EMT stated she would get a wheelchair in a minute and bring him back to room. Pt yelled in lobby and frustrated.  Pt is loud while being assisted back to room via wheelchair.

## 2018-02-17 NOTE — Progress Notes (Signed)
Attempted to get pt for his mri. Pt refused his scan. He refused to tell [place] his name or date of birth. Pt jerked his arm back when rebecca attempted to look at pt's arm band. Pt stated he might would do his test later. At that point rebecca informed him this was the only time we had for his scan and we needed to proceed to mri. Pt continued to refused. RN notified.

## 2018-02-17 NOTE — ED Notes (Signed)
Pt refused MRI and then became angry with staff. Pt stated "I haven't rested the whole time I was here!" Pt discharged AMA, but given crutches. When Ortho tech went in the room to get the patient sized for crutches, pt refused to get off the phone.  Pt refused to sign AMA.
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
## 2018-02-17 NOTE — ED Notes (Signed)
Pt angry with staff and saying curse words. Pt stated "I just wanted to rest"

## 2018-02-17 NOTE — Progress Notes (Signed)
Preliminary notes-Right lower extremity venous study completed. Negative for DVT where exam could approach. Limited study due to patient has severe pain and extremely sensitive for transducer touch his skin. Incidental finding: Complex and large amount of  fluid collection noted at calf.  Result notified Dr. Samuel Jester.   Hongying Artin Mceuen (RDMS RVT) 02/17/18 1:09 PM

## 2018-02-17 NOTE — ED Notes (Signed)
Pt walked back in lobby refuse to wait for triage nurse. Laying on the couch in lobby.
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
## 2018-02-20 DIAGNOSIS — S86911A Strain of unspecified muscle(s) and tendon(s) at lower leg level, right leg, initial encounter: Secondary | ICD-10-CM | POA: Insufficient documentation

## 2018-02-20 DIAGNOSIS — Z202 Contact with and (suspected) exposure to infections with a predominantly sexual mode of transmission: Secondary | ICD-10-CM | POA: Insufficient documentation

## 2018-02-20 DIAGNOSIS — S8991XA Unspecified injury of right lower leg, initial encounter: Secondary | ICD-10-CM | POA: Diagnosis present

## 2018-02-20 DIAGNOSIS — Y999 Unspecified external cause status: Secondary | ICD-10-CM | POA: Insufficient documentation

## 2018-02-20 DIAGNOSIS — F17210 Nicotine dependence, cigarettes, uncomplicated: Secondary | ICD-10-CM | POA: Insufficient documentation

## 2018-02-20 DIAGNOSIS — Y939 Activity, unspecified: Secondary | ICD-10-CM | POA: Diagnosis not present

## 2018-02-20 DIAGNOSIS — Z590 Homelessness: Secondary | ICD-10-CM | POA: Insufficient documentation

## 2018-02-20 DIAGNOSIS — X58XXXA Exposure to other specified factors, initial encounter: Secondary | ICD-10-CM | POA: Insufficient documentation

## 2018-02-20 DIAGNOSIS — Y929 Unspecified place or not applicable: Secondary | ICD-10-CM | POA: Insufficient documentation

## 2018-02-21 ENCOUNTER — Emergency Department (HOSPITAL_COMMUNITY)
Admission: EM | Admit: 2018-02-21 | Discharge: 2018-02-21 | Disposition: A | Discharge status: Home / Self Care | Payer: Medicaid Other | Attending: Emergency Medicine | Admitting: Emergency Medicine

## 2018-02-21 ENCOUNTER — Encounter (HOSPITAL_COMMUNITY): Payer: Self-pay

## 2018-02-21 ENCOUNTER — Emergency Department (HOSPITAL_COMMUNITY): Payer: Medicaid Other

## 2018-02-21 DIAGNOSIS — S86811A Strain of other muscle(s) and tendon(s) at lower leg level, right leg, initial encounter: Secondary | ICD-10-CM

## 2018-02-21 DIAGNOSIS — Z711 Person with feared health complaint in whom no diagnosis is made: Secondary | ICD-10-CM

## 2018-02-21 DIAGNOSIS — M79604 Pain in right leg: Secondary | ICD-10-CM

## 2018-02-21 LAB — URINALYSIS, ROUTINE W REFLEX MICROSCOPIC
BILIRUBIN URINE: NEGATIVE
Glucose, UA: NEGATIVE mg/dL
HGB URINE DIPSTICK: NEGATIVE
Ketones, ur: NEGATIVE mg/dL
Leukocytes, UA: NEGATIVE
Nitrite: NEGATIVE
PROTEIN: NEGATIVE mg/dL
Specific Gravity, Urine: 1.021 (ref 1.005–1.030)
pH: 8 (ref 5.0–8.0)

## 2018-02-21 LAB — GC/CHLAMYDIA PROBE AMP (~~LOC~~) NOT AT ARMC
CHLAMYDIA, DNA PROBE: NEGATIVE
Neisseria Gonorrhea: NEGATIVE

## 2018-02-21 MED ORDER — CEFTRIAXONE SODIUM 250 MG IJ SOLR
250.0000 mg | Freq: Once | INTRAMUSCULAR | Status: AC
  Administered 2018-02-21: 250 mg via INTRAMUSCULAR
  Filled 2018-02-21: qty 250

## 2018-02-21 MED ORDER — LIDOCAINE HCL (PF) 1 % IJ SOLN
2.0000 mL | Freq: Once | INTRAMUSCULAR | Status: AC
  Administered 2018-02-21: 2 mL via INTRADERMAL

## 2018-02-21 MED ORDER — AZITHROMYCIN 250 MG PO TABS
1000.0000 mg | ORAL_TABLET | Freq: Once | ORAL | Status: AC
  Administered 2018-02-21: 1000 mg via ORAL
  Filled 2018-02-21: qty 4

## 2018-02-21 MED ORDER — NAPROXEN 500 MG PO TABS: 500.0000 mg | ORAL_TABLET | Freq: Two times a day (BID) | ORAL | 0 refills | Status: AC
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
## 2018-02-21 NOTE — ED Notes (Signed)
Patient out in waiting room after discharge, pt stated he did not understand why he did not get HIV test that he requested, this RN spoke with patient's nurse Enis Gash, RN who advised that Dr. Wilkie Aye had instructed pt to go to the health dept for further testing if he wanted it, this RN spoke with pt regarding this, Pt stated to this RN "yall better be careful how you talk to people like me, I know a lot of yall have kids you want to go home to." this RN apologized if he felt anyone had been rude to him but that I was attempting to assist him, pt stated "no you have been cool, but some people here aren't."

## 2018-02-21 NOTE — ED Triage Notes (Signed)
Pt c/o of leg pain for one month in the R leg. Pt also requesting STD check and denies symptoms.

## 2018-02-21 NOTE — Discharge Instructions (Addendum)
You were seen today for right leg pain.  Your MRI shows a hematoma which is a collection of blood that is likely related to calf muscle tear.  It is very important that you follow-up with orthopedics.  Keep your right lower extremity elevated and ice.  Use crutches as needed.  Naproxen as needed for pain.  Regarding her concerns for STD.  You were tested and treated for gonorrhea and chlamydia.  If you would like HIV testing, this can be done at the local health department.

## 2018-04-22 ENCOUNTER — Emergency Department (HOSPITAL_COMMUNITY): Payer: Medicaid Other

## 2018-04-22 ENCOUNTER — Encounter (HOSPITAL_COMMUNITY): Payer: Self-pay | Admitting: Emergency Medicine

## 2018-04-22 ENCOUNTER — Emergency Department (HOSPITAL_COMMUNITY)
Admission: EM | Admit: 2018-04-22 | Discharge: 2018-04-22 | Disposition: A | Discharge status: Home / Self Care | Payer: Medicaid Other | Attending: Emergency Medicine | Admitting: Emergency Medicine

## 2018-04-22 DIAGNOSIS — F17200 Nicotine dependence, unspecified, uncomplicated: Secondary | ICD-10-CM | POA: Diagnosis not present

## 2018-04-22 DIAGNOSIS — Y999 Unspecified external cause status: Secondary | ICD-10-CM | POA: Insufficient documentation

## 2018-04-22 DIAGNOSIS — Y9302 Activity, running: Secondary | ICD-10-CM | POA: Insufficient documentation

## 2018-04-22 DIAGNOSIS — Y929 Unspecified place or not applicable: Secondary | ICD-10-CM | POA: Diagnosis not present

## 2018-04-22 DIAGNOSIS — E861 Hypovolemia: Secondary | ICD-10-CM | POA: Insufficient documentation

## 2018-04-22 DIAGNOSIS — S81832A Puncture wound without foreign body, left lower leg, initial encounter: Secondary | ICD-10-CM | POA: Insufficient documentation

## 2018-04-22 DIAGNOSIS — N289 Disorder of kidney and ureter, unspecified: Secondary | ICD-10-CM | POA: Insufficient documentation

## 2018-04-22 DIAGNOSIS — M79605 Pain in left leg: Secondary | ICD-10-CM | POA: Insufficient documentation

## 2018-04-22 DIAGNOSIS — I9589 Other hypotension: Secondary | ICD-10-CM | POA: Insufficient documentation

## 2018-04-22 DIAGNOSIS — Z79899 Other long term (current) drug therapy: Secondary | ICD-10-CM | POA: Diagnosis not present

## 2018-04-22 DIAGNOSIS — W540XXA Bitten by dog, initial encounter: Secondary | ICD-10-CM | POA: Insufficient documentation

## 2018-04-22 DIAGNOSIS — S8992XA Unspecified injury of left lower leg, initial encounter: Secondary | ICD-10-CM | POA: Diagnosis present

## 2018-04-22 LAB — ABO/RH: ABO/RH(D): O POS

## 2018-04-22 LAB — CBG MONITORING, ED: GLUCOSE-CAPILLARY: 136 mg/dL — AB (ref 70–99)

## 2018-04-22 LAB — BPAM RBC
BLOOD PRODUCT EXPIRATION DATE: 201907302359
Blood Product Expiration Date: 201908042359
ISSUE DATE / TIME: 201907130831
ISSUE DATE / TIME: 201907130128
UNIT TYPE AND RH: 9500
Unit Type and Rh: 9500

## 2018-04-22 LAB — CBC
HCT: 48.2 % (ref 39.0–52.0)
Hemoglobin: 14.9 g/dL (ref 13.0–17.0)
MCH: 31.7 pg (ref 26.0–34.0)
MCHC: 30.9 g/dL (ref 30.0–36.0)
MCV: 102.6 fL — AB (ref 78.0–100.0)
PLATELETS: 367 10*3/uL (ref 150–400)
RBC: 4.7 MIL/uL (ref 4.22–5.81)
RDW: 11.6 % (ref 11.5–15.5)
WBC: 10.9 10*3/uL — AB (ref 4.0–10.5)

## 2018-04-22 LAB — TYPE AND SCREEN
ABO/RH(D): O POS
Antibody Screen: NEGATIVE
UNIT DIVISION: 0
Unit division: 0

## 2018-04-22 LAB — CDS SEROLOGY

## 2018-04-22 LAB — I-STAT CHEM 8, ED
BUN: 14 mg/dL (ref 6–20)
CHLORIDE: 107 mmol/L (ref 98–111)
CREATININE: 1.8 mg/dL — AB (ref 0.61–1.24)
Calcium, Ion: 1.11 mmol/L — ABNORMAL LOW (ref 1.15–1.40)
Glucose, Bld: 144 mg/dL — ABNORMAL HIGH (ref 70–99)
HEMATOCRIT: 46 % (ref 39.0–52.0)
Hemoglobin: 15.6 g/dL (ref 13.0–17.0)
POTASSIUM: 4.8 mmol/L (ref 3.5–5.1)
Sodium: 140 mmol/L (ref 135–145)
TCO2: 12 mmol/L — ABNORMAL LOW (ref 22–32)

## 2018-04-22 LAB — BPAM FFP
BLOOD PRODUCT EXPIRATION DATE: 201907142359
Blood Product Expiration Date: 201907152359
ISSUE DATE / TIME: 201907130129
ISSUE DATE / TIME: 201907130129
UNIT TYPE AND RH: 600
Unit Type and Rh: 6200

## 2018-04-22 LAB — COMPREHENSIVE METABOLIC PANEL
ALT: 15 U/L (ref 0–44)
AST: 36 U/L (ref 15–41)
Albumin: 4.1 g/dL (ref 3.5–5.0)
Alkaline Phosphatase: 69 U/L (ref 38–126)
Anion gap: 27 — ABNORMAL HIGH (ref 5–15)
BILIRUBIN TOTAL: 1 mg/dL (ref 0.3–1.2)
BUN: 11 mg/dL (ref 6–20)
CHLORIDE: 105 mmol/L (ref 98–111)
CO2: 10 mmol/L — ABNORMAL LOW (ref 22–32)
CREATININE: 2.06 mg/dL — AB (ref 0.61–1.24)
Calcium: 9.4 mg/dL (ref 8.9–10.3)
GFR calc Af Amer: 43 mL/min — ABNORMAL LOW (ref >60)
GFR, EST NON AFRICAN AMERICAN: 37 mL/min — AB (ref >60)
Glucose, Bld: 151 mg/dL — ABNORMAL HIGH (ref 70–99)
Potassium: 4.8 mmol/L (ref 3.5–5.1)
Sodium: 142 mmol/L (ref 135–145)
Total Protein: 6.6 g/dL (ref 6.5–8.1)

## 2018-04-22 LAB — PREPARE FRESH FROZEN PLASMA: UNIT DIVISION: 0

## 2018-04-22 LAB — I-STAT CG4 LACTIC ACID, ED: Lactic Acid, Venous: >17 mmol/L (ref 0.5–1.9)

## 2018-04-22 LAB — PROTIME-INR
INR: 1.06
Prothrombin Time: 13.7 seconds (ref 11.4–15.2)

## 2018-04-22 LAB — ETHANOL: Alcohol, Ethyl (B): <10 mg/dL (ref <10)

## 2018-04-22 MED ORDER — AMOXICILLIN-POT CLAVULANATE 875-125 MG PO TABS: 1.0000 | ORAL_TABLET | Freq: Two times a day (BID) | ORAL | 0 refills | Status: AC

## 2018-04-22 MED ORDER — IOPAMIDOL (ISOVUE-370) INJECTION 76%
100.0000 mL | Freq: Once | INTRAVENOUS | Status: AC | PRN
  Administered 2018-04-22: 100 mL via INTRAVENOUS

## 2018-04-22 MED ORDER — FENTANYL CITRATE (PF) 100 MCG/2ML IJ SOLN
50.0000 ug | Freq: Once | INTRAMUSCULAR | Status: AC
  Administered 2018-04-22: 50 ug via INTRAVENOUS
  Filled 2018-04-22: qty 2

## 2018-04-22 MED ORDER — SODIUM CHLORIDE 0.9 % IV BOLUS (SEPSIS)
1000.0000 mL | Freq: Once | INTRAVENOUS | Status: AC
  Administered 2018-04-22: 1000 mL via INTRAVENOUS

## 2018-04-22 MED ORDER — SODIUM CHLORIDE 0.9 % IV SOLN
1000.0000 mL | INTRAVENOUS | Status: DC
  Administered 2018-04-22: 500 mL via INTRAVENOUS

## 2018-04-22 MED ORDER — SODIUM CHLORIDE 0.9 % IV SOLN
3.0000 g | Freq: Once | INTRAVENOUS | Status: AC
  Administered 2018-04-22: 3 g via INTRAVENOUS
  Filled 2018-04-22: qty 3

## 2018-04-22 MED ORDER — IOPAMIDOL (ISOVUE-370) INJECTION 76%
INTRAVENOUS | Status: AC
  Filled 2018-04-22: qty 100

## 2018-04-22 MED ORDER — SODIUM CHLORIDE 0.9 % IV SOLN
INTRAVENOUS | Status: AC | PRN
  Administered 2018-04-22: 2000 mL via INTRAVENOUS

## 2018-04-22 MED ORDER — IBUPROFEN 400 MG PO TABS
600.0000 mg | ORAL_TABLET | Freq: Once | ORAL | Status: AC
Start: 2018-04-22 — End: 2018-04-22
  Administered 2018-04-22: 600 mg via ORAL
  Filled 2018-04-22: qty 1
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
## 2018-04-22 NOTE — ED Provider Notes (Signed)
[facility]ENCY [facility] Provider Note   CSN: [id number] Arrival date & time: 04/22/18  0124     History   Chief Complaint Chief Complaint  Patient presents with  . Animal Bite    HPI Sean Gray is a 47 y.o. male.  HPI Patient is a 47 year old male that is brought to the emergency department as a level 1 trauma after being found by EMS to be hypotensive there was a routine traffic stop and the patient began to run.  He ran from police and jumped a fence.  He landed on his feet.  He admits that he was found and cornered and was taken down by the canine unit.  This resulted in several dog bite wounds to his left lower leg.  Presents without active bleeding.  Reports pain in his left leg.  Denies chest pain shortness of breath.  Denies abdominal pain.  Denies head or neck injury.  Denies weakness in his arms and legs.  He is able to wiggle his toes on his left.  Pain is moderate in severity   History reviewed. No pertinent past medical history.  There are no active problems to display for this patient.   Past Surgical History:  Procedure Laterality Date  . FACIAL COSMETIC SURGERY          Home Medications    Prior to Admission medications   Medication Sig Start Date End Date Taking? Authorizing Provider  amoxicillin (AMOXIL) 500 MG capsule Take 1 capsule (500 mg total) by mouth 3 (three) times daily. 03/06/14   Emilia BeckSzekalski, Kaitlyn, PA-C  amoxicillin-clavulanate (AUGMENTIN) 875-125 MG tablet Take 1 tablet by mouth every 12 (twelve) hours. 04/22/18   Azalia Bilisampos, Lessly Stigler, MD  cephALEXin (KEFLEX) 500 MG capsule Take 2 capsules (1,000 mg total) by mouth 2 (two) times daily. 12/12/13   Schinlever, Santina Evansatherine, PA-C  HYDROcodone-acetaminophen (NORCO/VICODIN) 5-325 MG per tablet Take 1 tablet by mouth every 4 (four) hours as needed for moderate pain. 12/12/13   Schinlever, Santina Evansatherine, PA-C  naproxen (NAPROSYN) 500 MG tablet Take 1 tablet (500 mg total) by mouth 2 (two)  times daily. 02/21/18   Horton, Mayer Maskerourtney F, MD  sildenafil (VIAGRA) 100 MG tablet Take 1 tablet (100 mg total) by mouth daily as needed for erectile dysfunction. 09/16/14   Reuben LikesKeller, David C, MD    Family History No family history on file.  Social History Social History   Tobacco Use  . Smoking status: Current Every Day Smoker  . Smokeless tobacco: Never Used  Substance Use Topics  . Alcohol use: Yes  . Drug use: No     Allergies   Patient has no known allergies.   Review of Systems Review of Systems  All other systems reviewed and are negative.    Physical Exam Updated Vital Signs BP 119/86   Pulse 80   Temp 97.7 F (36.5 C) (Oral)   Resp 18   Ht 5\' 8"  (1.727 m)   Wt 79.4 kg (175 lb)   SpO2 99%   BMI 26.61 kg/m   Physical Exam  Constitutional: He is oriented to person, place, and time. He appears well-developed and well-nourished.  HENT:  Head: Normocephalic and atraumatic.  Eyes: EOM are normal.  Neck: Normal range of motion.  Cardiovascular: Normal rate, regular rhythm, normal heart sounds and intact distal pulses.  Pulmonary/Chest: Effort normal and breath sounds normal. No respiratory distress.  Abdominal: Soft. He exhibits no distension. There is no tenderness.  Musculoskeletal:  Several puncture  wounds to left lower leg.  Normal PT and DP pulse present once blood pressure improved.  Compartments of the left lower extremity are soft.  No active bleeding at this time.  Neurological: He is alert and oriented to person, place, and time.  Skin: Skin is warm and dry.  Psychiatric: He has a normal mood and affect. Judgment normal.  Nursing note and vitals reviewed.    ED Treatments / Results  Labs (all labs ordered are listed, but only abnormal results are displayed) Labs Reviewed  COMPREHENSIVE METABOLIC PANEL - Abnormal; Notable for the following components:      Result Value   CO2 10 (*)    Glucose, Bld 151 (*)    Creatinine, Ser 2.06 (*)    GFR  calc non Af Amer 37 (*)    GFR calc Af Amer 43 (*)    Anion gap 27 (*)    All other components within normal limits  CBC - Abnormal; Notable for the following components:   WBC 10.9 (*)    MCV 102.6 (*)    All other components within normal limits  I-STAT CHEM 8, ED - Abnormal; Notable for the following components:   Creatinine, Ser 1.80 (*)    Glucose, Bld 144 (*)    Calcium, Ion 1.11 (*)    TCO2 12 (*)    All other components within normal limits  I-STAT CG4 LACTIC ACID, ED - Abnormal; Notable for the following components:   Lactic Acid, Venous >17.00 (*)    All other components within normal limits  CBG MONITORING, ED - Abnormal; Notable for the following components:   Glucose-Capillary 136 (*)    All other components within normal limits  CDS SEROLOGY  ETHANOL  PROTIME-INR  URINALYSIS, ROUTINE W REFLEX MICROSCOPIC  TYPE AND SCREEN  PREPARE FRESH FROZEN PLASMA  ABO/RH    EKG EKG Interpretation  Date/Time:  Saturday April 22 2018 01:37:24 EDT Ventricular Rate:  89 PR Interval:    QRS Duration: 83 QT Interval:  375 QTC Calculation: 457 R Axis:   80 Text Interpretation:  Sinus rhythm ST elev, probable normal early repol pattern No significant change was found Confirmed by Azalia Bilis ([id number]) on 04/22/2018 6:45:30 AM   Radiology Ct Angio Low Extrem Left W &/or Wo Contrast  Result Date: 04/22/2018 CLINICAL DATA:  47 year old male with dog bite to the left lower extremity. EXAM: CT ANGIOGRAPHY OF THE left lowerEXTREMITY TECHNIQUE: Multidetector CT imaging of the left lowerwas performed using the standard protocol during bolus administration of intravenous contrast. Multiplanar CT image reconstructions and MIPs were obtained to evaluate the vascular anatomy. CONTRAST:  ISOVUE-370 IOPAMIDOL (ISOVUE-370) INJECTION 76% COMPARISON:  Left lower extremity radiograph dated 04/22/2018 FINDINGS: The visualized portion of the left common iliac artery, internal and external iliac  arteries, common femoral, deep and superficial femoral arteries, popliteal artery, anterior and posterior tibial and peroneal arteries, dorsalis pedis and plantar arteries appear patent and unremarkable. No evidence of acute or traumatic vascular injury. No significant atherosclerotic disease. There is no acute fracture or dislocation. The bones are well mineralized. No arthritic changes. There is a small suprapatellar effusion. There is laceration of skin of the calf in keeping with territory injury. No large fluid collection or hematoma. Review of the MIP images confirms the above findings. IMPRESSION: 1. No acute/traumatic vascular injury. 2. No acute/traumatic osseous injury. 3. Laceration of the skin of the calf in keeping with history of penetrating injury. Electronically Signed   By: Burtis Junes  Radparvar M.D.   On: 04/22/2018 03:23   Dg Chest Portable 1 View  Result Date: 04/22/2018 CLINICAL DATA:  47 year old male with dog bite to the left calf. EXAM: PORTABLE CHEST 1 VIEW COMPARISON:  None. FINDINGS: The heart size and mediastinal contours are within normal limits. Both lungs are clear. The visualized skeletal structures are unremarkable. IMPRESSION: No active disease. Electronically Signed   By: Elgie Collard M.D.   On: 04/22/2018 02:04   Dg Tibia/fibula Left Port  Result Date: 04/22/2018 CLINICAL DATA:  47 year old male with dog bite to the left leg. EXAM: PORTABLE LEFT TIBIA AND FIBULA - 2 VIEW COMPARISON:  Left lower extremity radiograph dated 06/27/2017 FINDINGS: There is no acute fracture or dislocation. The bones are well mineralized. No arthritic changes. There is laceration of the skin and soft tissues of the calf. No radiopaque foreign object. IMPRESSION: 1. No acute/traumatic osseous pathology. 2. Laceration of the skin and soft tissues of the calf in keeping with penetrating injury. Electronically Signed   By: Elgie Collard M.D.   On: 04/22/2018 02:03    Procedures .Critical  Care Performed by: Azalia Bilis, MD Authorized by: Azalia Bilis, MD     CRITICAL CARE Performed by: Azalia Bilis Total critical care time: 35 minutes Critical care time was exclusive of separately billable procedures and treating other patients. Critical care was necessary to treat or prevent imminent or life-threatening deterioration. Critical care was time spent personally by me on the following activities: development of treatment plan with patient and/or surrogate as well as nursing, discussions with consultants, evaluation of patient's response to treatment, examination of patient, obtaining history from patient or surrogate, ordering and performing treatments and interventions, ordering and review of laboratory studies, ordering and review of radiographic studies, pulse oximetry and re-evaluation of patient's condition.   Medications Ordered in ED Medications  iopamidol (ISOVUE-370) 76 % injection (has no administration in time range)  sodium chloride 0.9 % bolus 1,000 mL (0 mLs Intravenous Stopped 04/22/18 0300)    Followed by  sodium chloride 0.9 % bolus 1,000 mL (0 mLs Intravenous Stopped 04/22/18 0230)    Followed by  sodium chloride 0.9 % bolus 1,000 mL (0 mLs Intravenous Stopped 04/22/18 0239)    Followed by  0.9 %  sodium chloride infusion (0 mLs Intravenous Stopped 04/22/18 0410)  0.9 %  sodium chloride infusion ( Intravenous Stopped 04/22/18 0204)  Ampicillin-Sulbactam (UNASYN) 3 g in sodium chloride 0.9 % 100 mL IVPB ( Intravenous Stopped 04/22/18 0315)  fentaNYL (SUBLIMAZE) injection 50 mcg (50 mcg Intravenous Given 04/22/18 0204)  iopamidol (ISOVUE-370) 76 % injection 100 mL (100 mLs Intravenous Contrast Given 04/22/18 0216)  ibuprofen (ADVIL,MOTRIN) tablet 600 mg (600 mg Oral Given 04/22/18 0428)     Initial Impression / Assessment and Plan / ED Course  I have reviewed the triage vital signs and the nursing notes.  Pertinent labs & imaging results that were available  during my care of the patient were reviewed by me and considered in my medical decision making (see chart for details).     Level 1 trauma initiated by EMS charge nurse secondary to hypotension.  There is no significant traumatic abnormality however besides his dog bite wounds the left lower leg.  He had no major mechanism to explain occult trauma.  He has no abdominal tenderness.  He reports that he did not eat or drink much today.  He is responded to IV fluids.  He will be initiated on IV Unasyn for dog  bites to the left lower leg.  He is currently under police custody.  After evaluation and treatment with IV fluids and CT Angie of the left lower extremity was deemed as though he was medically clear.  He is received IV antibiotics.  He will be discharged home on twice daily Augmentin for 7 days.  Local wound care performed by me including saline with faint Betadine wash.  All wounds will be left open.  Infection warnings have been given to the patient.  He will be going to the local jail.  They can keep an eye on it there.  I recommended twice daily dressing changes.  Patient is encouraged to request return visit to the emergency department for new or worsening symptoms or signs of infection as we discussed.  Final Clinical Impressions(s) / ED Diagnoses   Final diagnoses:  Dog bite  Hypotension due to hypovolemia  Renal insufficiency    ED Discharge Orders        Ordered    amoxicillin-clavulanate (AUGMENTIN) 875-125 MG tablet  Every 12 hours     04/22/18 0404       Azalia Bilis, MD 04/22/18 [phone]

## 2018-04-22 NOTE — ED Notes (Signed)
Patient transported to CT 

## 2018-04-22 NOTE — ED Notes (Signed)
Pt departed in NAD, and in custody of [employer].  

## 2018-04-22 NOTE — ED Triage Notes (Signed)
BIB EMS after dog bite. Pt was at a traffic stop and ran from police, was chased by K9 unit and was bit on LLE. Upon arrival to ED pt became lethargic, diaphoretic, hypotensive and was made Lvl 1 Trauma. BP 70/30. Pt admits to ETOH and cocaine use.

## 2019-10-03 IMAGING — DX DG TIBIA/FIBULA PORT 2V*L*
2 series · 2 of 2 positions shown · non-contrast
Comparison: Left lower extremity radiograph dated 06/27/2017

CLINICAL DATA: 47-year-old male with dog bite to the left leg.

EXAM:
PORTABLE LEFT TIBIA AND FIBULA - 2 VIEW

[tibia ap (1 of 2)]
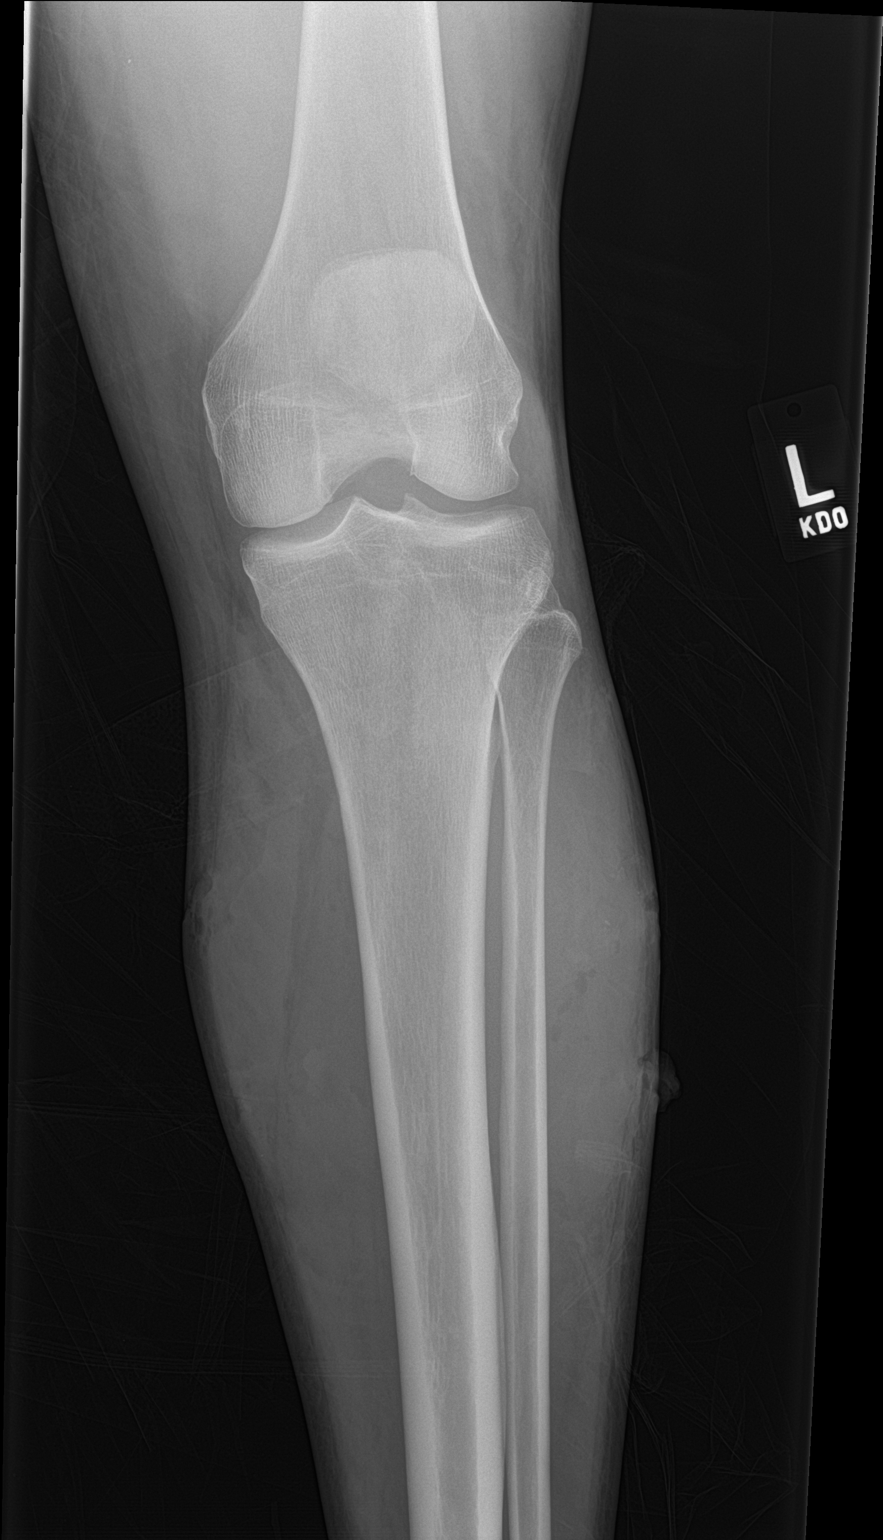

[tibia ap (2 of 2)]
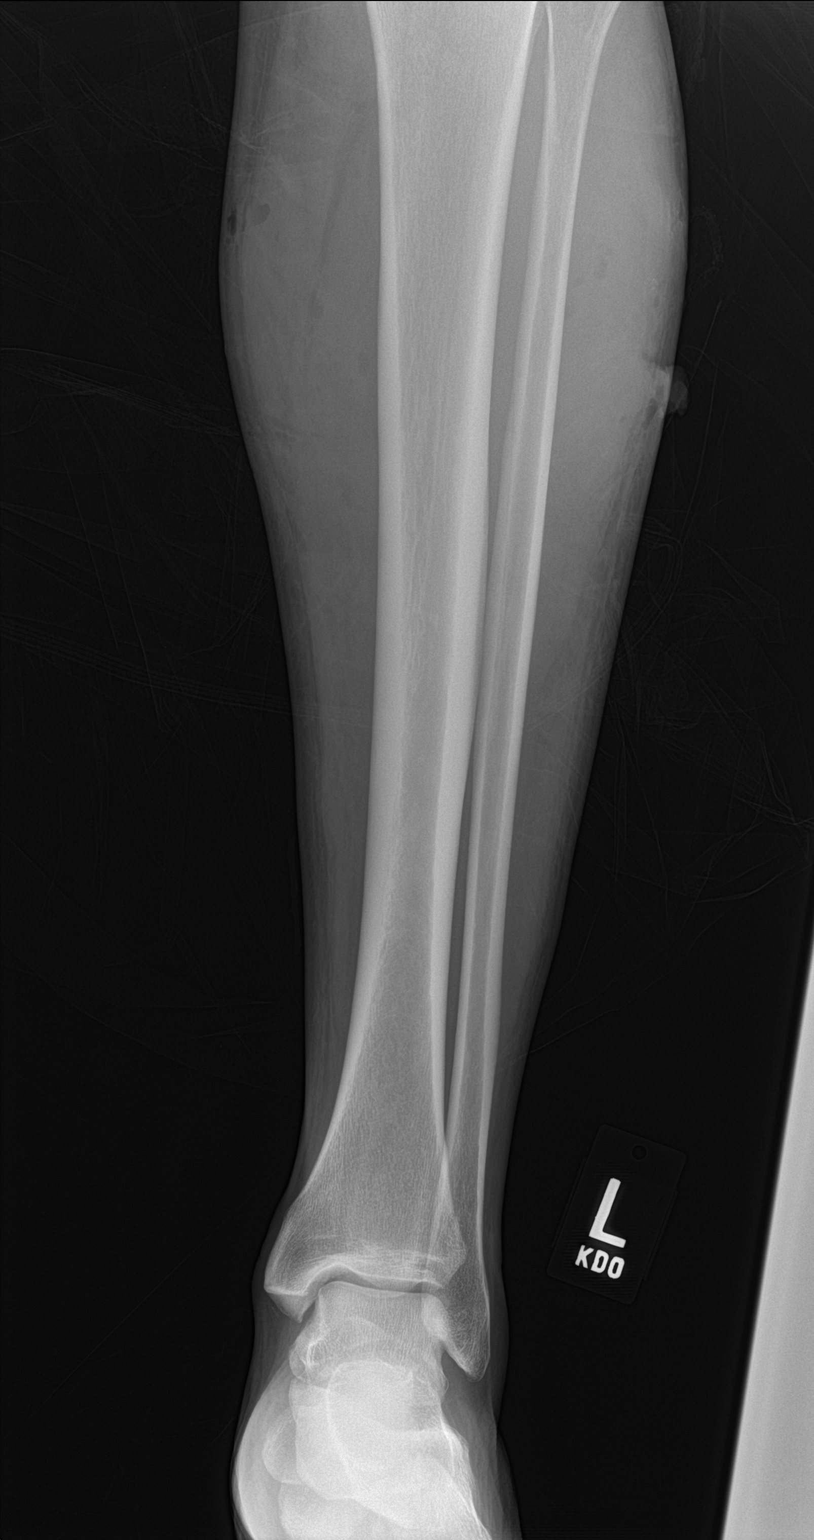

[2 of 2 positions shown; findings below may reference images not displayed]

FINDINGS: There is no acute fracture or dislocation. The bones are well
mineralized. No arthritic changes. There is laceration of the skin
and soft tissues of the calf. No radiopaque foreign object.
IMPRESSION: 1. No acute/traumatic osseous pathology.
2. Laceration of the skin and soft tissues of the calf in keeping
with penetrating injury.
